# Patient Record
Sex: Female | Born: 1995 | Race: Black or African American | Hispanic: No | Marital: Single | Smoking: Never smoker
Health system: Southern US, Community
[De-identification: ages and names within clinical notes are randomized; demographics above are authoritative.]

## PROBLEM LIST (undated history)

## (undated) HISTORY — PX: NO PAST SURGERIES: SHX2092

---

## 2017-01-20 ENCOUNTER — Emergency Department: Payer: Self-pay

## 2017-01-20 ENCOUNTER — Emergency Department
Admission: EM | Admit: 2017-01-20 | Discharge: 2017-01-21 | Disposition: A | Discharge status: Home / Self Care | Payer: Self-pay | Attending: Emergency Medicine | Admitting: Emergency Medicine

## 2017-01-20 ENCOUNTER — Encounter: Payer: Self-pay | Admitting: Emergency Medicine

## 2017-01-20 DIAGNOSIS — O4690 Antepartum hemorrhage, unspecified, unspecified trimester: Secondary | ICD-10-CM | POA: Insufficient documentation

## 2017-01-20 DIAGNOSIS — D649 Anemia, unspecified: Secondary | ICD-10-CM | POA: Insufficient documentation

## 2017-01-20 DIAGNOSIS — O021 Missed abortion: Secondary | ICD-10-CM | POA: Insufficient documentation

## 2017-01-20 LAB — COMPREHENSIVE METABOLIC PANEL
ALBUMIN: 4 g/dL (ref 3.5–5.0)
ALT: 15 U/L (ref 14–54)
ANION GAP: 7 (ref 5–15)
AST: 25 U/L (ref 15–41)
Alkaline Phosphatase: 41 U/L (ref 38–126)
BUN: 12 mg/dL (ref 6–20)
CHLORIDE: 104 mmol/L (ref 101–111)
CO2: 26 mmol/L (ref 22–32)
Calcium: 8.9 mg/dL (ref 8.9–10.3)
Creatinine, Ser: 0.65 mg/dL (ref 0.44–1.00)
GFR calc non Af Amer: >60 mL/min (ref >60)
GLUCOSE: 91 mg/dL (ref 65–99)
POTASSIUM: 3.9 mmol/L (ref 3.5–5.1)
SODIUM: 137 mmol/L (ref 135–145)
Total Bilirubin: 0.5 mg/dL (ref 0.3–1.2)
Total Protein: 7.7 g/dL (ref 6.5–8.1)

## 2017-01-20 LAB — URINALYSIS, COMPLETE (UACMP) WITH MICROSCOPIC
BILIRUBIN URINE: NEGATIVE
Glucose, UA: NEGATIVE mg/dL
KETONES UR: 5 mg/dL — AB
NITRITE: NEGATIVE
Protein, ur: NEGATIVE mg/dL
Specific Gravity, Urine: 1.025 (ref 1.005–1.030)
pH: 5 (ref 5.0–8.0)

## 2017-01-20 LAB — CBC
HCT: 33 % — ABNORMAL LOW (ref 35.0–47.0)
HEMOGLOBIN: 10.8 g/dL — AB (ref 12.0–16.0)
MCH: 25.5 pg — ABNORMAL LOW (ref 26.0–34.0)
MCHC: 32.6 g/dL (ref 32.0–36.0)
MCV: 78.1 fL — ABNORMAL LOW (ref 80.0–100.0)
PLATELETS: 424 10*3/uL (ref 150–440)
RBC: 4.23 MIL/uL (ref 3.80–5.20)
RDW: 19.2 % — AB (ref 11.5–14.5)
WBC: 7.3 10*3/uL (ref 3.6–11.0)

## 2017-01-20 LAB — ABO/RH: ABO/RH(D): A POS

## 2017-01-20 LAB — POCT PREGNANCY, URINE: PREG TEST UR: POSITIVE — AB

## 2017-01-20 NOTE — ED Triage Notes (Signed)
Patient ambulatory to triage with steady gait, without difficulty or distress noted; pt reports approx [redacted]wks pregnant, now with vag bleeding today noted upon wiping after urinating; denies any pain; [facility]

## 2017-01-20 NOTE — ED Notes (Signed)
Patient unable to urinate at this time but was given a specimen cup to collect a sample when able.

## 2017-01-21 LAB — HCG, QUANTITATIVE, PREGNANCY: hCG, Beta Chain, Quant, S: 6768 m[IU]/mL — ABNORMAL HIGH (ref <5)

## 2017-01-21 NOTE — ED Provider Notes (Signed)
[facility] Emergency Department Provider Note  ____________________________________________  Time seen: Approximately 12:18 AM  I have reviewed the triage vital signs and the nursing notes.   HISTORY  Chief Complaint Vaginal Bleeding    HPI Rebekah Mccoy is a 21 y.o. female who presents to the emergency department for evaluation ofvaginal bleeding. She states that she had some light spotting a few days ago that went away but today while at work, she noticed some bright red bleeding and passing small sized clots. She denies abdominal pain or cramping. She has had not nausea or vomiting. She has used 1 full pad today. She was evaluated by the health department who confirmed her pregnancy. She has another appointment next week for a Gyn visit.    History reviewed. No pertinent past medical history.  There are no active problems to display for this patient.   History reviewed. No pertinent surgical history.  Prior to Admission medications   Not on File    Allergies Patient has no known allergies.  No family history on file.  Social History Social History  Substance Use Topics  . Smoking status: Never Smoker  . Smokeless tobacco: Never Used  . Alcohol use No    Review of Systems Constitutional: Negative for fever. Respiratory: Negative for shortness of breath or cough. Gastrointestinal: Negative for abdominal pain; negative for nausea , negataive for vomiting. Genitourinary: Negative for dysuria , negative for vaginal discharge. Positive for vaginal bleeding. Musculoskeletal: Negative for back pain. Skin: Warm and dry. ____________________________________________   PHYSICAL EXAM:  VITAL SIGNS: ED Triage Vitals [01/20/17 2256]  Enc Vitals Group     BP 135/75     Pulse Rate 87     Resp 18     Temp 98.1 F (36.7 C)     Temp Source Oral     SpO2 100 %     Weight 190 lb (86.2 kg)     Height 5\' 2"  (1.575 m)     Head Circumference       Peak Flow      Pain Score      Pain Loc      Pain Edu?      Excl. in GC?     Constitutional: Alert and oriented. Well appearing and in no acute distress. Eyes: Conjunctivae are normal. PERRL. EOMI. Head: Atraumatic. Nose: No congestion/rhinnorhea. Mouth/Throat: Mucous membranes are moist. Respiratory: Normal respiratory effort.  No retractions. Gastrointestinal: Soft and nontender. Genitourinary: Pelvic exam: Small amount of dark blood noted in the vaginal vault. Bright red blood noted to be slowly oozing from the cervical os which remains closed.  Musculoskeletal: No extremity tenderness nor edema.  Neurologic:  Normal speech and language. No gross focal neurologic deficits are appreciated. Speech is normal. No gait instability. Skin:  Skin is warm, dry and intact. No rash noted. Psychiatric: Mood and affect are normal. Speech and behavior are normal.  ____________________________________________   LABS (all labs ordered are listed, but only abnormal results are displayed)  Labs Reviewed  CBC - Abnormal; Notable for the following:       Result Value   Hemoglobin 10.8 (*)    HCT 33.0 (*)    MCV 78.1 (*)    MCH 25.5 (*)    RDW 19.2 (*)    All other components within normal limits  HCG, QUANTITATIVE, PREGNANCY - Abnormal; Notable for the following:    hCG, Beta Chain, Quant, S 6,768 (*)    All other components within  normal limits  URINALYSIS, COMPLETE (UACMP) WITH MICROSCOPIC - Abnormal; Notable for the following:    Color, Urine YELLOW (*)    APPearance CLEAR (*)    Hgb urine dipstick LARGE (*)    Ketones, ur 5 (*)    Leukocytes, UA TRACE (*)    Bacteria, UA RARE (*)    Squamous Epithelial / LPF 0-5 (*)    All other components within normal limits  POCT PREGNANCY, URINE - Abnormal; Notable for the following:    Preg Test, Ur POSITIVE (*)    All other components within normal limits  COMPREHENSIVE METABOLIC PANEL  ABO/RH    ____________________________________________  RADIOLOGY  Single intrauterine pregnancy with an estimated gestational age of [redacted] weeks, 3 days. No fetal cardiac activity identified. Findings consistent with failed early pregnancy. ____________________________________________   PROCEDURES  Procedure(s) performed: None  ____________________________________________  21 year old female who presents to the emergency department for evaluation of vaginal bleeding. Single IUP without cardiac activity is identified on the US. These results were discussed with the patient who was visibly upset. She agrees to call and schedule a follow up with the gynecologist. She was strongly advised to return to the ER for vaginal bleeding that becomes greater than 1 pad per hour or passing large clots. Pelvic rest was also discussed. She verbalized understanding of all instructions.   INITIAL IMPRESSION / ASSESSMENT AND PLAN / ED COURSE  Pertinent labs & imaging results that were available during my care of the patient were reviewed by me and considered in my medical decision making (see chart for details).  ____________________________________________   FINAL CLINICAL IMPRESSION(S) / ED DIAGNOSES  Final diagnoses:  Vaginal bleeding in pregnancy  Missed abortion with fetal demise before 20 completed weeks of gestation  Anemia, unspecified type    Note:  This document was prepared using Dragon voice recognition software and may include unintentional dictation errors.    Chinita Pesterriplett, Dakisha Schoof B, FNP 01/21/17 2345    Darci CurrentBrown, Burnett N, MD 01/22/17 [phone]

## 2017-01-21 NOTE — ED Notes (Signed)
Upon assessment pt reports 2 episodes of spotting blood upon wiping. Pt denies cramping.

## 2017-01-21 NOTE — Discharge Instructions (Signed)
Return to the ER immediately if you are bleeding more than a pad per hour or for other symptoms of concern.  Call and schedule an appointment with the gynecologist first thing in the morning.

## 2017-01-23 ENCOUNTER — Encounter: Payer: Self-pay | Admitting: Obstetrics and Gynecology

## 2017-01-23 ENCOUNTER — Ambulatory Visit (INDEPENDENT_AMBULATORY_CARE_PROVIDER_SITE_OTHER): Payer: Self-pay | Admitting: Obstetrics and Gynecology

## 2017-01-23 VITALS — BP 117/69 | HR 88 | Ht 62.0 in | Wt 194.4 lb

## 2017-01-23 DIAGNOSIS — O021 Missed abortion: Secondary | ICD-10-CM

## 2017-01-23 MED ORDER — MISOPROSTOL 200 MCG PO TABS: 800.0000 ug | ORAL_TABLET | Freq: Once | ORAL | 0 refills | Status: AC

## 2017-01-23 MED ORDER — OXYCODONE-ACETAMINOPHEN 5-325 MG PO TABS: 1.0000 | ORAL_TABLET | Freq: Four times a day (QID) | ORAL | 0 refills | Status: AC | PRN

## 2017-01-23 NOTE — Progress Notes (Signed)
GYNECOLOGY PROGRESS NOTE  Subjective:    Patient ID: Rebekah Mccoy, female    DOB: 1995/12/30, 21 y.o.   MRN: [id number]  HPI  Patient is a 21 y.o. [id number] female who presents for foolow up after miscarriage.  Patient was seen in the Emergency Room 3 days ago with complaints of vaginal bleeding and passing small clots with associated cramping.  While in the Emergency she underwent a workup including an ultrasound whic noted a missed abortion at 7 weeks of pregnancy.  Patient notes that since then she has still had some more bleeding with clots and is now starting to slow down but is unsure if she has passed anything.   History reviewed. No pertinent past medical history.   Past Surgical History:  Procedure Laterality Date  . NO PAST SURGERIES      Family History  Problem Relation Age of Onset  . Cancer Neg Hx   . Diabetes Neg Hx   . Hypertension Neg Hx     Social History   Social History  . Marital status: Single    Spouse name: N/A  . Number of children: N/A  . Years of education: N/A   Occupational History  . Not on file.   Social History Main Topics  . Smoking status: Never Smoker  . Smokeless tobacco: Never Used  . Alcohol use No  . Drug use: No  . Sexual activity: Yes    Birth control/ protection: None   Other Topics Concern  . Not on file   Social History Narrative  . No narrative on file     Review of Systems Pertinent items noted in HPI and remainder of comprehensive ROS otherwise negative.   Objective:   Blood pressure 117/69, pulse 88, height 5' 2"  (1.575 m), weight 194 lb 6.4 oz (88.2 kg), last menstrual period 11/26/2016, not currently breastfeeding. General appearance: alert and no distress Abdomen: soft, non-tender; bowel sounds normal; no masses,  no organomegaly Pelvic: external genitalia normal, rectovaginal septum normal.  Vagina with scant dark red blood.  Cervix closed, normal appearing, no lesions and no motion tenderness.  Uterus  mobile, nontender, normal shape and size.  Adnexae non-palpable, nontender bilaterally.  Extremities: extremities normal, atraumatic, no cyanosis or edema Neurologic: Grossly normal      Labs:  Admission on 01/20/2017, Discharged on 01/21/2017  Component Date Value Ref Range Status  . WBC 01/20/2017 7.3  3.6 - 11.0 K/uL Final  . RBC 01/20/2017 4.23  3.80 - 5.20 MIL/uL Final  . Hemoglobin 01/20/2017 10.8* 12.0 - 16.0 g/dL Final  . HCT 01/20/2017 33.0* 35.0 - 47.0 % Final  . MCV 01/20/2017 78.1* 80.0 - 100.0 fL Final  . MCH 01/20/2017 25.5* 26.0 - 34.0 pg Final  . MCHC 01/20/2017 32.6  32.0 - 36.0 g/dL Final  . RDW 01/20/2017 19.2* 11.5 - 14.5 % Final  . Platelets 01/20/2017 424  150 - 440 K/uL Final  . hCG, Beta Chain, Quant, S 01/20/2017 6768* <5 mIU/mL Final   Comment:          GEST. AGE      CONC.  (mIU/mL)   <=1 WEEK        5 - 50     2 WEEKS       50 - 500     3 WEEKS       100 - 10,000     4 WEEKS     1,000 - 30,000     5  WEEKS     3,500 - 115,000   6-8 WEEKS     12,000 - 270,000    12 WEEKS     15,000 - 220,000        FEMALE AND NON-PREGNANT FEMALE:     LESS THAN 5 mIU/mL   . Sodium 01/20/2017 137  135 - 145 mmol/L Final  . Potassium 01/20/2017 3.9  3.5 - 5.1 mmol/L Final   HEMOLYSIS AT THIS LEVEL MAY AFFECT RESULT  . Chloride 01/20/2017 104  101 - 111 mmol/L Final  . CO2 01/20/2017 26  22 - 32 mmol/L Final  . Glucose, Bld 01/20/2017 91  65 - 99 mg/dL Final  . BUN 01/20/2017 12  6 - 20 mg/dL Final  . Creatinine, Ser 01/20/2017 0.65  0.44 - 1.00 mg/dL Final  . Calcium 01/20/2017 8.9  8.9 - 10.3 mg/dL Final  . Total Protein 01/20/2017 7.7  6.5 - 8.1 g/dL Final  . Albumin 01/20/2017 4.0  3.5 - 5.0 g/dL Final  . AST 01/20/2017 25  15 - 41 U/L Final  . ALT 01/20/2017 15  14 - 54 U/L Final  . Alkaline Phosphatase 01/20/2017 41  38 - 126 U/L Final  . Total Bilirubin 01/20/2017 0.5  0.3 - 1.2 mg/dL Final  . GFR calc non Af Amer 01/20/2017 >60  >60 mL/min Final  . GFR  calc Af Amer 01/20/2017 >60  >60 mL/min Final   Comment: (NOTE) The eGFR has been calculated using the CKD EPI equation. This calculation has not been validated in all clinical situations. eGFR's persistently <60 mL/min signify possible Chronic Kidney Disease.   . Anion gap 01/20/2017 7  5 - 15 Final  . Color, Urine 01/20/2017 YELLOW* YELLOW Final  . APPearance 01/20/2017 CLEAR* CLEAR Final  . Specific Gravity, Urine 01/20/2017 1.025  1.005 - 1.030 Final  . pH 01/20/2017 5.0  5.0 - 8.0 Final  . Glucose, UA 01/20/2017 NEGATIVE  NEGATIVE mg/dL Final  . Hgb urine dipstick 01/20/2017 LARGE* NEGATIVE Final  . Bilirubin Urine 01/20/2017 NEGATIVE  NEGATIVE Final  . Ketones, ur 01/20/2017 5* NEGATIVE mg/dL Final  . Protein, ur 01/20/2017 NEGATIVE  NEGATIVE mg/dL Final  . Nitrite 01/20/2017 NEGATIVE  NEGATIVE Final  . Leukocytes, UA 01/20/2017 TRACE* NEGATIVE Final  . RBC / HPF 01/20/2017 6-30  0 - 5 RBC/hpf Final  . WBC, UA 01/20/2017 6-30  0 - 5 WBC/hpf Final  . Bacteria, UA 01/20/2017 RARE* NONE SEEN Final  . Squamous Epithelial / LPF 01/20/2017 0-5* NONE SEEN Final  . Mucous 01/20/2017 PRESENT   Final  . ABO/RH(D) 01/20/2017 A POS   Final  . Preg Test, Ur 01/20/2017 POSITIVE* NEGATIVE Final   Comment:        THE SENSITIVITY OF THIS METHODOLOGY IS >24 mIU/mL     Lab Results  Component Value Date   ABORH A POS 01/20/2017    Assessment:   Missed abortion Mild anemia  Plan:   - Patient unsure if she has passed any products.  Is concerned as to whether or not her miscarriage has completed as she is planning on moving to a new state on Monday and is worried that she may require further management and will not have established care with anyone.  Will f/u with repeat ultrasound tomorrow.  If missed abortion still present, discussed management options, including expectant management (for up to 3 weeks), medical management with Cytotec, or possible surgical management with D&Cprior to  her departure.  Discussed risks  and benefits of each method.  Patient notes that she would like to do medical management with Cytotec. Given prescription for Cytotec and for pain medication for intense cramping. Given bleeding precautions.  - Mild anemia present during ER visit, patient asymptomatic.  Will need to take a daily iron supplement fo r1-2 weeks. after miscarriage complete due to anticipated blood loss during procedure.    A total of 20 minutes were spent face-to-face with the patient during the encounter with greater than 50% dealing with counseling and coordination of care.   Rubie Maid, MD [facility]

## 2017-01-23 NOTE — Patient Instructions (Signed)
Dilation and Curettage or Vacuum Curettage Dilation and curettage (D&C) and vacuum curettage are minor procedures. A D&C involves stretching (dilation) the cervix and scraping (curettage) the inside lining of the uterus (endometrium). During a D&C, tissue is gently scraped from the endometrium, starting from the top portion of the uterus down to the lowest part of the uterus (cervix). During a vacuum curettage, the lining and tissue in the uterus are removed with the use of gentle suction. Curettage may be performed to either diagnose or treat a problem. As a diagnostic procedure, curettage is performed to examine tissues from the uterus. A diagnostic curettage may be done if you have:  Irregular bleeding in the uterus.  Bleeding with the development of clots.  Spotting between menstrual periods.  Prolonged menstrual periods or other abnormal bleeding.  Bleeding after menopause.  No menstrual period (amenorrhea).  A change in size and shape of the uterus.  Abnormal endometrial cells discovered during a Pap test.  As a treatment procedure, curettage may be performed for the following reasons:  Removal of an IUD (intrauterine device).  Removal of retained placenta after giving birth.  Abortion.  Miscarriage.  Removal of endometrial polyps.  Removal of uncommon types of noncancerous lumps (fibroids).  Tell a health care provider about:  Any allergies you have, including allergies to prescribed medicine or latex.  All medicines you are taking, including vitamins, herbs, eye drops, creams, and over-the-counter medicines. This is especially important if you take any blood-thinning medicine. Bring a list of all of your medicines to your appointment.  Any problems you or family members have had with anesthetic medicines.  Any blood disorders you have.  Any surgeries you have had.  Your medical history and any medical conditions you have.  Whether you are pregnant or may be  pregnant.  Recent vaginal infections you have had.  Recent menstrual periods, bleeding problems you have had, and what form of birth control (contraception) you use. What are the risks? Generally, this is a safe procedure. However, problems may occur, including:  Infection.  Heavy vaginal bleeding.  Allergic reactions to medicines.  Damage to the cervix or other structures or organs.  Development of scar tissue (adhesions) inside the uterus, which can cause abnormal amounts of menstrual bleeding. This may make it harder to get pregnant in the future.  A hole (perforation) or puncture in the uterine wall. This is rare.  What happens before the procedure? Staying hydrated Follow instructions from your health care provider about hydration, which may include:  Up to 2 hours before the procedure - you may continue to drink clear liquids, such as water, clear fruit juice, black coffee, and plain tea.  Eating and drinking restrictions Follow instructions from your health care provider about eating and drinking, which may include:  8 hours before the procedure - stop eating heavy meals or foods such as meat, fried foods, or fatty foods.  6 hours before the procedure - stop eating light meals or foods, such as toast or cereal.  6 hours before the procedure - stop drinking milk or drinks that contain milk.  2 hours before the procedure - stop drinking clear liquids. If your health care provider told you to take your medicine(s) on the day of your procedure, take them with only a sip of water.  Medicines  Ask your health care provider about: ? Changing or stopping your regular medicines. This is especially important if you are taking diabetes medicines or blood thinners. ? Taking   medicines such as aspirin and ibuprofen. These medicines can thin your blood. Do not take these medicines before your procedure if your health care provider instructs you not to.  You may be given antibiotic  medicine to help prevent infection. General instructions  For 24 hours before your procedure, do not: ? Douche. ? Use tampons. ? Use medicines, creams, or suppositories in the vagina. ? Have sexual intercourse.  You may be given a pregnancy test on the day of the procedure.  Plan to have someone take you home from the hospital or clinic.  You may have a blood or urine sample taken.  If you will be going home right after the procedure, plan to have someone with you for 24 hours. What happens during the procedure?  To reduce your risk of infection: ? Your health care team will wash or sanitize their hands. ? Your skin will be washed with soap.  An IV tube will be inserted into one of your veins.  You will be given one of the following: ? A medicine that numbs the area in and around the cervix (local anesthetic). ? A medicine to make you fall asleep (general anesthetic).  You will lie down on your back, with your feet in foot rests (stirrups).  The size and position of your uterus will be checked.  A lubricated instrument (speculum or Sims retractor) will be inserted into the back side of your vagina. The speculum will be used to hold apart the walls of your vagina so your health care provider can see your cervix.  A tool (tenaculum) will be attached to the lip of the cervix to stabilize it.  Your cervix will be softened and dilated. This may be done by: ? Taking a medicine. ? Having tapered dilators or thin rods (laminaria) or gradual widening instruments (tapered dilators) inserted into your cervix.  A small, sharp, curved instrument (curette) will be used to scrape a small amount of tissue or cells from the endometrium or cervical canal. In some cases, gentle suction is applied with the curette. The curette will then be removed. The cells will be taken to a lab for testing. The procedure may vary among health care providers and hospitals. What happens after the  procedure?  You may have mild cramping, backache, pain, and light bleeding or spotting. You may pass small blood clots from your vagina.  You may have to wear compression stockings. These stockings help to prevent blood clots and reduce swelling in your legs.  Your blood pressure, heart rate, breathing rate, and blood oxygen level will be monitored until the medicines you were given have worn off. Summary  Dilation and curettage (D&C) involves stretching (dilation) the cervix and scraping (curettage) the inside lining of the uterus (endometrium).  After the procedure, you may have mild cramping, backache, pain, and light bleeding or spotting. You may pass small blood clots from your vagina.  Plan to have someone take you home from the hospital or clinic. This information is not intended to replace advice given to you by your health care provider. Make sure you discuss any questions you have with your health care provider. Document Released: 06/10/2005 Document Revised: 02/25/2016 Document Reviewed: 02/25/2016 Elsevier Interactive Patient Education  2018 [employer] Miscarriage A miscarriage is the loss of an unborn baby (fetus) before the 20th week of pregnancy. The cause is often unknown. Follow these instructions at home:  You may need to stay in bed (bed rest), or you may be  able to do light activity. Go about activity as told by your doctor.  Have help at home.  Write down how many pads you use each day. Write down how soaked they are.  Do not use tampons. Do not wash out your vagina (douche) or have sex (intercourse) until your doctor approves.  Only take medicine as told by your doctor.  Do not take aspirin.  Keep all doctor visits as told.  If you or your partner have problems with grieving, talk to your doctor. You can also try counseling. Give yourself time to grieve before trying to get pregnant again. Get help right away if:  You have bad cramps or pain in your  back or belly (abdomen).  You have a fever.  You pass large clumps of blood (clots) from your vagina that are walnut-sized or larger. Save the clumps for your doctor to see.  You pass large amounts of tissue from your vagina. Save the tissue for your doctor to see.  You have more bleeding.  You have thick, bad-smelling fluid (discharge) coming from the vagina.  You get lightheaded, weak, or you pass out (faint).  You have chills. This information is not intended to replace advice given to you by your health care provider. Make sure you discuss any questions you have with your health care provider. Document Released: 09/02/2011 Document Revised: 11/16/2015 Document Reviewed: 07/11/2011 Elsevier Interactive Patient Education  2017 [employer]

## 2017-01-24 ENCOUNTER — Ambulatory Visit (INDEPENDENT_AMBULATORY_CARE_PROVIDER_SITE_OTHER): Payer: Self-pay

## 2017-01-24 DIAGNOSIS — O021 Missed abortion: Secondary | ICD-10-CM

## 2017-01-24 LAB — BETA HCG QUANT (REF LAB): hCG Quant: 2396 m[IU]/mL

## 2017-01-25 ENCOUNTER — Encounter: Payer: Self-pay | Admitting: Obstetrics and Gynecology

## 2017-01-25 DIAGNOSIS — O021 Missed abortion: Secondary | ICD-10-CM | POA: Insufficient documentation

## 2017-01-31 ENCOUNTER — Telehealth: Payer: Self-pay | Admitting: Obstetrics and Gynecology

## 2017-01-31 NOTE — Telephone Encounter (Signed)
Patient called and stated that she needs to speak with Edson Snowballki in regards to her medication she is taking, The patient is unaware of how long she should be on her medication. Patient did not disclose any other information. Please advise.

## 2017-01-31 NOTE — Telephone Encounter (Signed)
Called pt advised her that she does not need to continue with Cytotec or Percocet at this time as miscarriage should be complete.

## 2017-09-24 IMAGING — US US OB COMP LESS 14 WK
1 series · 13 of 28 positions shown · non-contrast
Comparison: None.

CLINICAL DATA: 21-year-old female with vaginal bleeding. LMP:
11/26/2016 corresponding to an estimated gestational age of 7 weeks,
6 days.

EXAM:
OBSTETRIC <14 WK US AND TRANSVAGINAL OB US
TECHNIQUE: Both transabdominal and transvaginal ultrasound examinations were
performed for complete evaluation of the gestation as well as the
maternal uterus, adnexal regions, and pelvic cul-de-sac.
Transvaginal technique was performed to assess early pregnancy.

[Series 1: us ob comp less 14 wk · 0.23mm/px · 53 acquisitions, 13 frames shown]
[im 2/53]
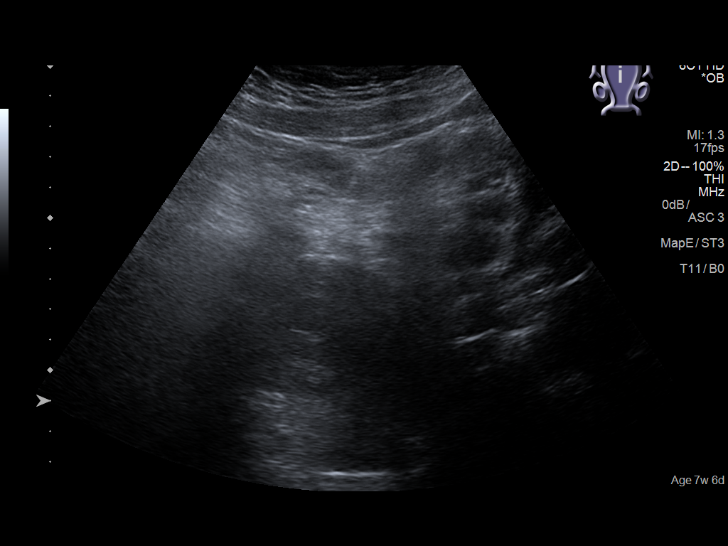
[im 6/53]
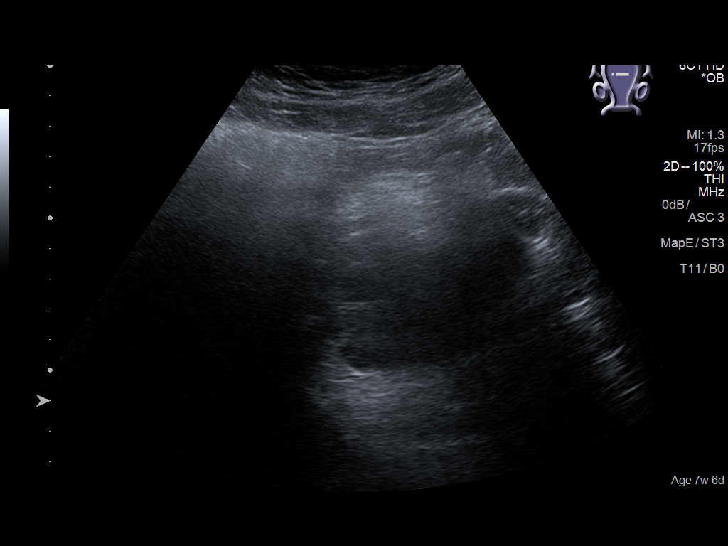
[im 10/53]
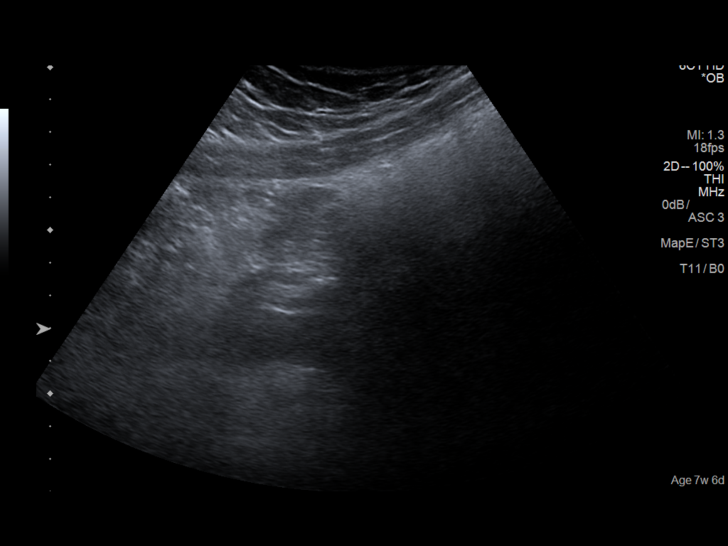
[im 14/53]
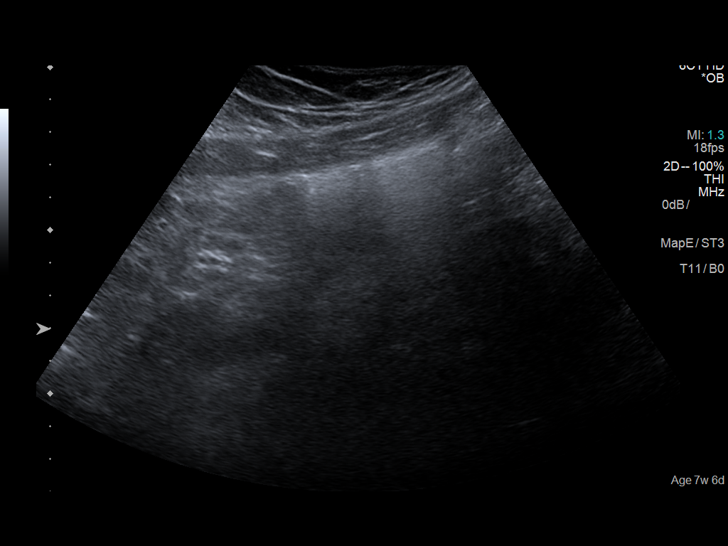
[im 18/53]
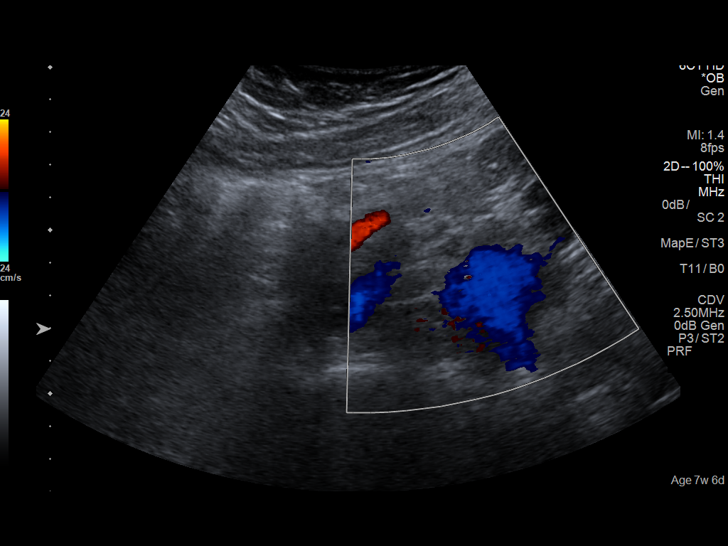
[im 22/53]
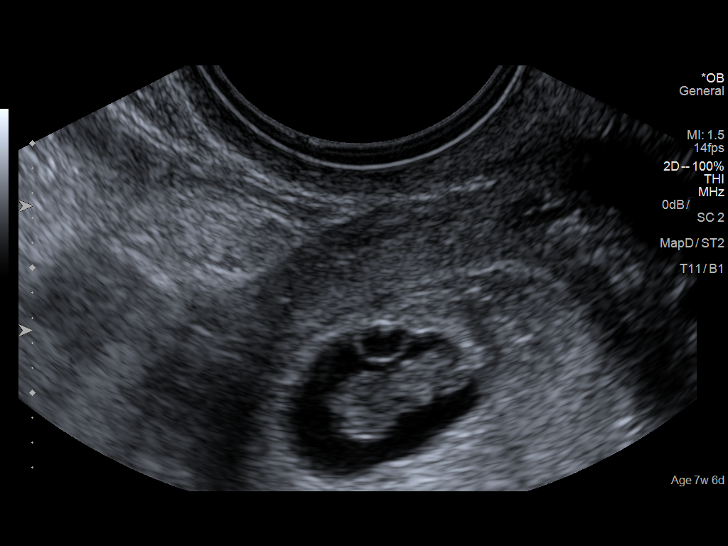
[im 27/53]
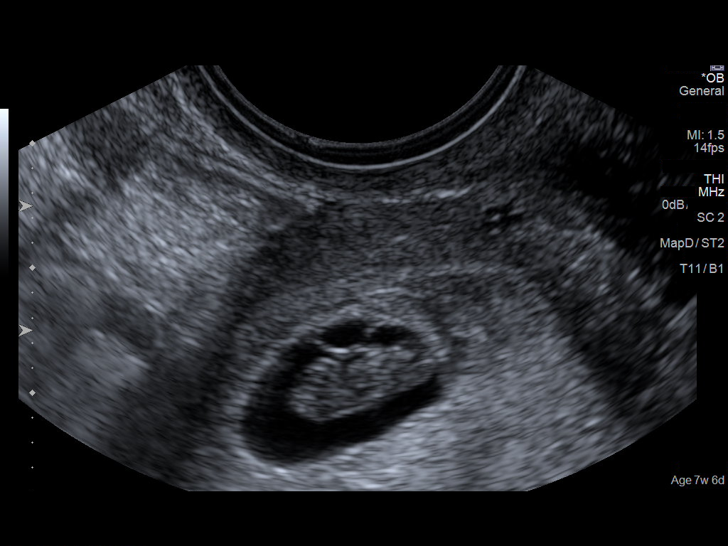
[im 31/53]
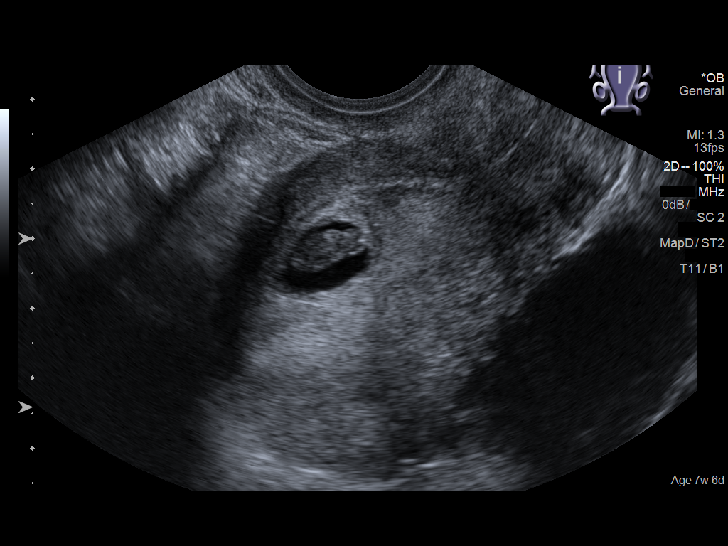
[im 35/53]
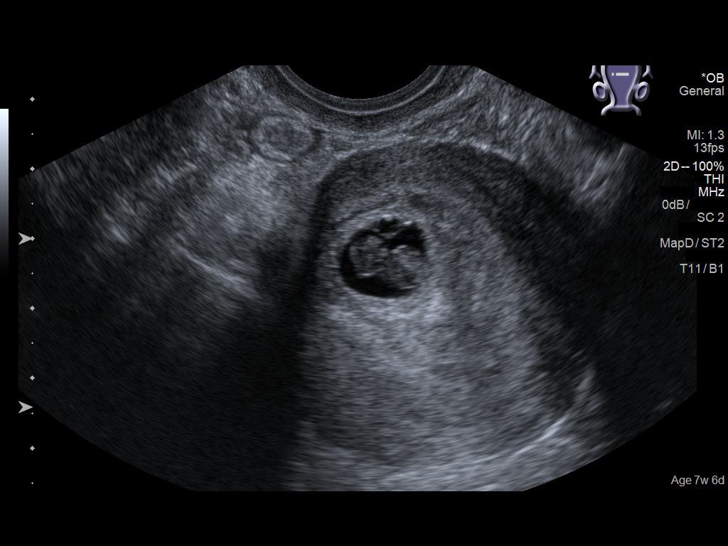
[im 39/53]
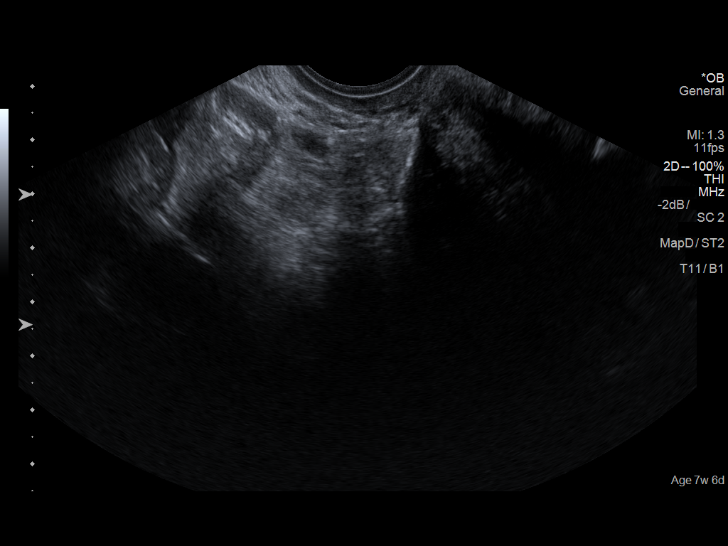
[im 43/53]
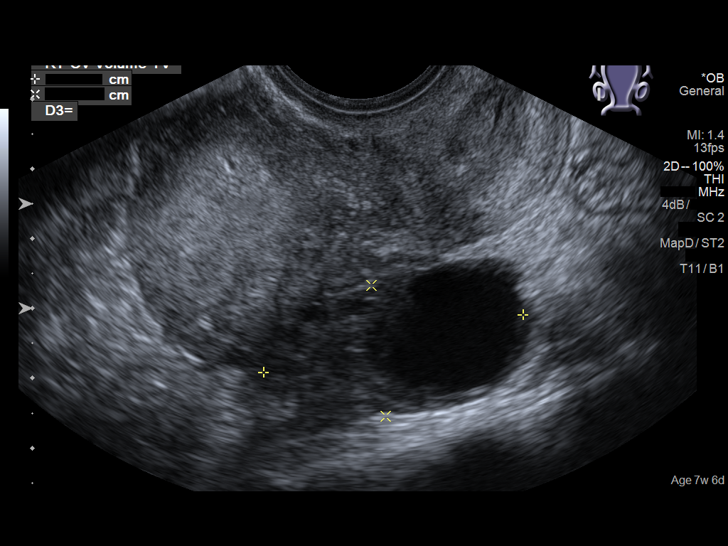
[im 47/53]
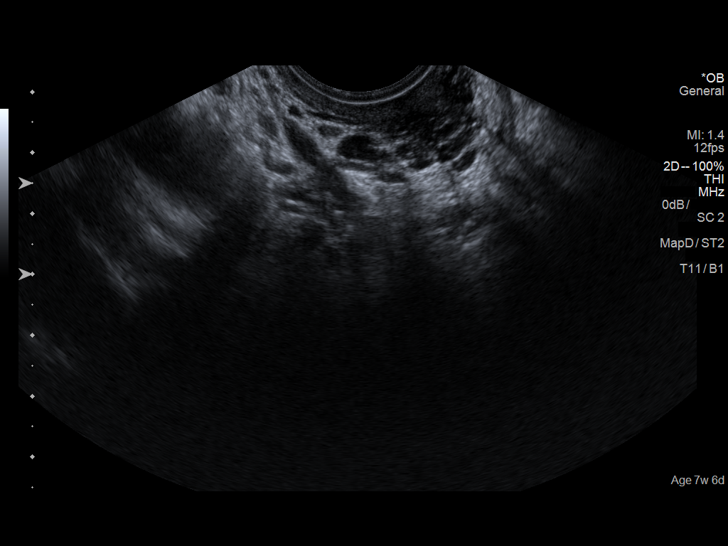
[im 51/53]
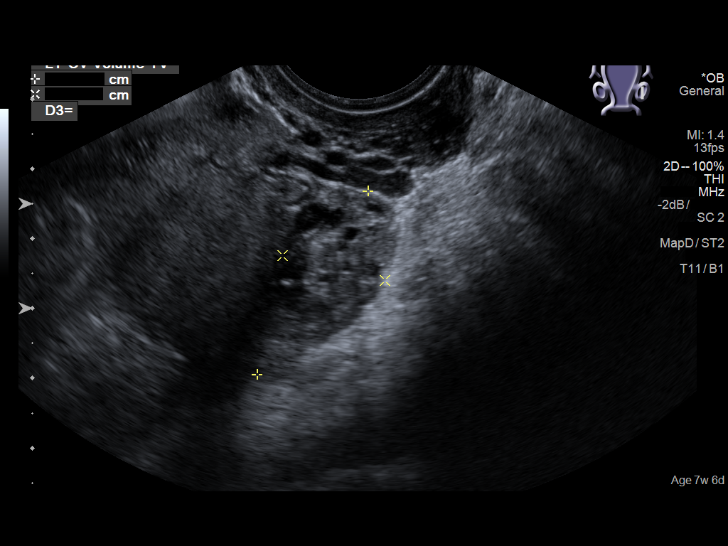

[13 of 28 positions shown; findings below may reference images not displayed]

FINDINGS: Intrauterine gestational sac: Single intrauterine gestational sac.

Yolk sac:  Seen

Embryo:  Present

Cardiac Activity: Not detected.

CRL:  12  mm   7 w   3 d                  US EDC: 09/05/2017

Subchorionic hemorrhage:  None visualized.

Maternal uterus/adnexae: There is a 1.8 x 1.6 x 1.8 cm cyst in the
right ovary. The left ovary is unremarkable. Trace amount of fluid
noted within the endocervical canal.
IMPRESSION: Single intrauterine pregnancy with an estimated gestational age of 7
weeks, 3 days. No fetal cardiac activity identified. Findings
consistent with failed early pregnancy.

These results were called by telephone at the time of interpretation
on 01/21/2017 at [DATE] to Dr. Ceola , who verbally acknowledged
these results.
